# Patient Record
Sex: Male | Born: 1970 | Race: White | Hispanic: No | Marital: Married | Smoking: Former smoker
Health system: Southern US, Community
[De-identification: ages and names within clinical notes are randomized; demographics above are authoritative.]

---

## 2014-08-12 ENCOUNTER — Ambulatory Visit (INDEPENDENT_AMBULATORY_CARE_PROVIDER_SITE_OTHER): Payer: BC Managed Care – PPO

## 2014-08-12 ENCOUNTER — Ambulatory Visit (INDEPENDENT_AMBULATORY_CARE_PROVIDER_SITE_OTHER): Payer: BC Managed Care – PPO | Admitting: Sports Medicine

## 2014-08-12 ENCOUNTER — Encounter: Payer: Self-pay | Admitting: Sports Medicine

## 2014-08-12 VITALS — BP 145/92 | HR 72 | Ht 74.0 in | Wt 234.0 lb

## 2014-08-12 DIAGNOSIS — IMO0002 Reserved for concepts with insufficient information to code with codable children: Secondary | ICD-10-CM

## 2014-08-12 DIAGNOSIS — M5416 Radiculopathy, lumbar region: Secondary | ICD-10-CM

## 2014-08-12 MED ORDER — PREDNISONE 50 MG PO TABS: ORAL_TABLET | ORAL | Status: DC

## 2014-08-12 MED ORDER — MELOXICAM 15 MG PO TABS: ORAL_TABLET | ORAL | Status: DC

## 2014-08-12 NOTE — Progress Notes (Signed)
Patient ID: Lee Huber, male   DOB: [birth year], 43 y.o.   MRN: [id number]    Subjective:    I'm seeing this patient as a consultation for:  Radiontchenko, Lee Manns, MD   CC: Right knee weakness  HPI: Lee Huber is a 43 year old male with history of lumbar disc herniation who presents with 2 months of weakness in the right knee. He states that this began while doing squats in a cross-fit workout, when he experienced a shooting sensation running from the right side of his back into his right toes that he describes as "electric." Reports no numbness, tingling, or altered sensation after this initial event; however, has had persistent weakness of his right knee. He states that he often feels his knee might collapse while exercising. Has tried a knee brace while running that he claims provides minimal relief. No history of previous injury to the knee. Does see a neurologist for management of his disc herniation; however, he is unable to recall the level of the herniation.  Past medical history, Surgical history, Family history not pertinant except as noted below, Social history, Allergies, and medications have been entered into the medical record, reviewed, and no changes needed.   Review of Systems: No headache, visual changes, nausea, vomiting, diarrhea, constipation, dizziness, abdominal pain, skin rash, fevers, chills, night sweats, weight loss, swollen lymph nodes, body aches, joint swelling, muscle aches, chest pain, shortness of breath, mood changes, visual or auditory hallucinations.   Objective:   General: Well Developed, well nourished, and in no acute distress.  Neuro/Psych: Alert and oriented x3, extra-ocular muscles intact, able to move all 4 extremities, sensation grossly intact. Skin: Warm and dry, no rashes noted.  Respiratory: Not using accessory muscles, speaking in full sentences, trachea midline.  Cardiovascular: Pulses palpable, no extremity edema. Abdomen: Does not appear  distended.   Right Knee: Normal to inspection with no erythema or effusion or obvious bony abnormalities. Palpation normal with no warmth, joint line tenderness, patellar tenderness, or condyle tenderness. ROM full in flexion and extension and lower leg rotation. Ligaments with solid consistent endpoints including ACL, PCL, LCL, MCL. Negative Mcmurray's, Apley's, and Thessalonian tests. Non painful patellar compression. Patellar glide without crepitus. Patellar and quadriceps tendons unremarkable. Hamstring and quadriceps strength is normal.   Back Exam:  Inspection: Unremarkable  Motion: Flexion 45 deg, Extension 45 deg, Side Bending to 45 deg bilaterally,  Rotation to 45 deg bilaterally  SLR laying: Briefly positive XSLR laying: Negative  Palpable tenderness: None. FABER: negative. Sensory change: Gross sensation intact to all lumbar and sacral dermatomes.  Reflexes: 2+ at both patellar tendons, 2+ at achilles tendons, Babinski's downgoing.   Strength at foot  Plantar-flexion: 5/5 Dorsi-flexion: 5/5 Eversion: 5/5 Inversion: 5/5  Leg strength  Quad: 5/5 Hamstring: 5/5 Hip flexor: 5/5 Hip abductors: 5/5  Gait unremarkable.  Impression and Recommendations:   This case required medical decision making of moderate complexity.  Right Knee Pain: Completely negative knee exam suggests that this weakness is not attributable to injury of the knee. With a previous history of disc herniation that has caused radicular symptoms in the left leg, this history of right-sided radicular symptoms with subsequent muscular weakness is likely radiculopathy from the protruding lumbar disc. MRI is not currently available in order to identify the exact involved disc. - Meloxicam - Referral to PT - 5 day course of Prednisone - If no better in 1 month, will perform MRI of the spine to evaluate disc herniation further.

## 2014-08-12 NOTE — Assessment & Plan Note (Signed)
Right-sided radiculopathy. He will avoid provocative maneuvers in the gym, adding formal physical therapy, prednisone, Mobic. X-rays. Return to see me in 4 weeks, MRI for interventional injection planning if no better.

## 2014-08-19 ENCOUNTER — Ambulatory Visit: Payer: BC Managed Care – PPO | Admitting: Physical Therapy

## 2014-08-22 ENCOUNTER — Ambulatory Visit (INDEPENDENT_AMBULATORY_CARE_PROVIDER_SITE_OTHER): Payer: BC Managed Care – PPO | Admitting: Physical Therapy

## 2014-08-22 DIAGNOSIS — M6281 Muscle weakness (generalized): Secondary | ICD-10-CM

## 2014-08-22 DIAGNOSIS — IMO0002 Reserved for concepts with insufficient information to code with codable children: Secondary | ICD-10-CM

## 2014-08-22 DIAGNOSIS — R5381 Other malaise: Secondary | ICD-10-CM

## 2014-08-29 ENCOUNTER — Encounter (INDEPENDENT_AMBULATORY_CARE_PROVIDER_SITE_OTHER): Payer: BC Managed Care – PPO | Admitting: Physical Therapy

## 2014-08-29 DIAGNOSIS — M6281 Muscle weakness (generalized): Secondary | ICD-10-CM

## 2014-08-29 DIAGNOSIS — R5381 Other malaise: Secondary | ICD-10-CM

## 2014-08-29 DIAGNOSIS — IMO0002 Reserved for concepts with insufficient information to code with codable children: Secondary | ICD-10-CM

## 2014-09-05 ENCOUNTER — Encounter (INDEPENDENT_AMBULATORY_CARE_PROVIDER_SITE_OTHER): Payer: BC Managed Care – PPO | Admitting: Physical Therapy

## 2014-09-05 DIAGNOSIS — R5381 Other malaise: Secondary | ICD-10-CM

## 2014-09-05 DIAGNOSIS — M6281 Muscle weakness (generalized): Secondary | ICD-10-CM

## 2014-09-05 DIAGNOSIS — IMO0002 Reserved for concepts with insufficient information to code with codable children: Secondary | ICD-10-CM

## 2014-09-10 ENCOUNTER — Encounter: Payer: Self-pay | Admitting: Sports Medicine

## 2014-09-10 ENCOUNTER — Ambulatory Visit (INDEPENDENT_AMBULATORY_CARE_PROVIDER_SITE_OTHER): Payer: BC Managed Care – PPO | Admitting: Sports Medicine

## 2014-09-10 VITALS — BP 146/94 | HR 62 | Ht 74.0 in | Wt 234.0 lb

## 2014-09-10 DIAGNOSIS — IMO0002 Reserved for concepts with insufficient information to code with codable children: Secondary | ICD-10-CM | POA: Diagnosis not present

## 2014-09-10 DIAGNOSIS — M5416 Radiculopathy, lumbar region: Secondary | ICD-10-CM

## 2014-09-10 NOTE — Progress Notes (Signed)
Patient ID: Lee Huber, male   DOB: 11-10-1971, 43 y.o.   MRN: [id number]  Subjective:    CC: Bilateral knee weakness  HPI: Lee Huber is a very pleasant 43 year old man with history of lumbar disc herniation who presents with bilateral knee weakness, left worse than right, with associated left-sided radicular symptoms in the L3/L4 distribution. Left-sided leg weakness with radicular symptoms due to disc herniation has been long-standing and is managed by an outside neurologist. Right-sided weakness started 3 months ago while he was doing squats and felt an electric, shooting sensation from the right side of his back into his right toes. Radicular symptoms of the right leg subsequently subsided. During our visit last month (8/24), he was started on conservative treatment with a 5 day course of Prednisone, daily Meloxicam, and formal PT. He states that these interventions have provided absolutely no relief. No saddle paresthesias, loss of bowel or bladder control. States that his gait changed slightly after his weight lifting accident, but has not continued to deteriorate since then.  Past medical history, Surgical history, Family history not pertinant except as noted below, Social history, Allergies, and medications have been entered into the medical record, reviewed, and no changes needed.   Review of Systems: No fevers, chills, night sweats, weight loss, chest pain, or shortness of breath.   Objective:    General: Well developed, well nourished, and in no acute distress.  Neuro: Alert and oriented x3, extra-ocular muscles intact, sensation grossly intact.  HEENT: Normocephalic, atraumatic, pupils equal round reactive to light, neck supple. Skin: Warm and dry, no rashes. Cardiac: Regular rate and rhythm, no murmurs rubs or gallops, no lower extremity edema.  Respiratory: Clear to auscultation bilaterally. Not using accessory muscles, speaking in full sentences.  Back Exam:  Inspection:  Unremarkable  Motion: Flexion 45 deg, Extension 45 deg, Side Bending to 45 deg bilaterally,  Rotation to 45 deg bilaterally  SLR laying: Negative  XSLR laying: Negative  Palpable tenderness: None. FABER: negative. Sensory change: Gross sensation intact to all lumbar and sacral dermatomes.  Reflexes: 2+ at both patellar tendons, 2+ at achilles tendons, Babinski's downgoing.  Strength at foot  Plantar-flexion: 5/5 Dorsi-flexion: 5/5 Eversion: 5/5 Inversion: 5/5  Leg strength  Quad: 5/5 Hamstring: 5/5 Hip flexor: 5/5 Hip abductors: 5/5  Gait unremarkable.  Impression and Recommendations:   Lumbar Radiculopathy: This bilateral lumbar radiculopathy, left worse than right, appears to be in an L3 distribution. He feels that he is not overly limited by the weakness and would like to continue conservative treatment with rehabilitation exercises at this time. - Continue rehabilitation exercises for an additional 2 months, with plan to proceed with MRI and intervention if no improvement.

## 2014-09-10 NOTE — Assessment & Plan Note (Signed)
Lumbar radiculopathy is bilateral, currently left worse than right, and an L3 distribution. Overall he's not doing that bad so he would like to continue rehabilitation exercises for an additional 2 months before proceeding to advanced imaging and intervention.

## 2014-09-12 ENCOUNTER — Encounter: Payer: BC Managed Care – PPO | Admitting: Physical Therapy

## 2014-11-11 ENCOUNTER — Ambulatory Visit (INDEPENDENT_AMBULATORY_CARE_PROVIDER_SITE_OTHER): Payer: BC Managed Care – PPO | Admitting: Sports Medicine

## 2014-11-11 ENCOUNTER — Encounter: Payer: Self-pay | Admitting: Sports Medicine

## 2014-11-11 DIAGNOSIS — M5416 Radiculopathy, lumbar region: Secondary | ICD-10-CM | POA: Diagnosis not present

## 2014-11-11 NOTE — Progress Notes (Signed)
  Subjective:    CC: Follow-up  HPI: Left lumbar radiculopathy: Left-sided, L3 distribution, pain is moderate, persistent. He recently went to a local pain doctor who did 2 epidurals at the L5-S1 level per patient report, there was no MRIs done. He has never had interventional treatment at the L3-L4 level. Pain is moderate, persistent.  Past medical history, Surgical history, Family history not pertinant except as noted below, Social history, Allergies, and medications have been entered into the medical record, reviewed, and no changes needed.   Review of Systems: No fevers, chills, night sweats, weight loss, chest pain, or shortness of breath.   Objective:    General: Well Developed, well nourished, and in no acute distress.  Neuro: Alert and oriented x3, extra-ocular muscles intact, sensation grossly intact.  HEENT: Normocephalic, atraumatic, pupils equal round reactive to light, neck supple, no masses, no lymphadenopathy, thyroid nonpalpable.  Skin: Warm and dry, no rashes. Cardiac: Regular rate and rhythm, no murmurs rubs or gallops, no lower extremity edema.  Respiratory: Clear to auscultation bilaterally. Not using accessory muscles, speaking in full sentences.  Impression and Recommendations:

## 2014-11-11 NOTE — Assessment & Plan Note (Signed)
Status post two epidurals, sounds to be L5-S1. Not much response. It sounds like this was done without an MRI. Clinically his symptoms represent more of a left-sided L3 radiculopathy. We will obtain records from his spine interventional list. I am going to obtain an MRI, and this will likely lead to an L3-L4 interlaminar epidural.

## 2014-11-12 ENCOUNTER — Telehealth: Payer: Self-pay | Admitting: *Deleted

## 2014-11-12 NOTE — Telephone Encounter (Signed)
No prior auth required for MRI lumbar as per Rosanne AshingJim at [facility]. Radiology notified. Corliss SkainsJamie Quintrell Baze, CMA

## 2014-11-13 IMAGING — CR DG LUMBAR SPINE COMPLETE 4+V
5 series · 5 of 5 positions shown · non-contrast
Comparison: None.

CLINICAL DATA: Bilateral leg weakness.  Right lumbar radiculopathy.

EXAM:
LUMBAR SPINE - COMPLETE 4+ VIEW

[view not recorded (1 of 5)]
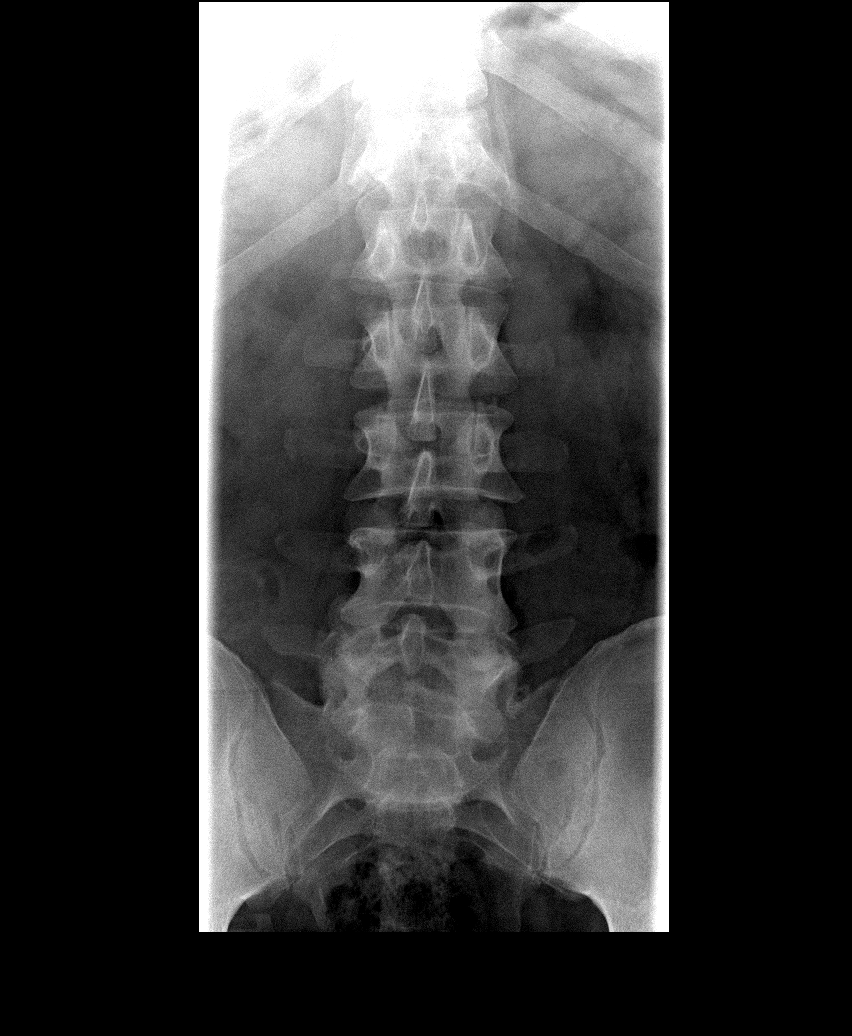

[view not recorded (2 of 5)]
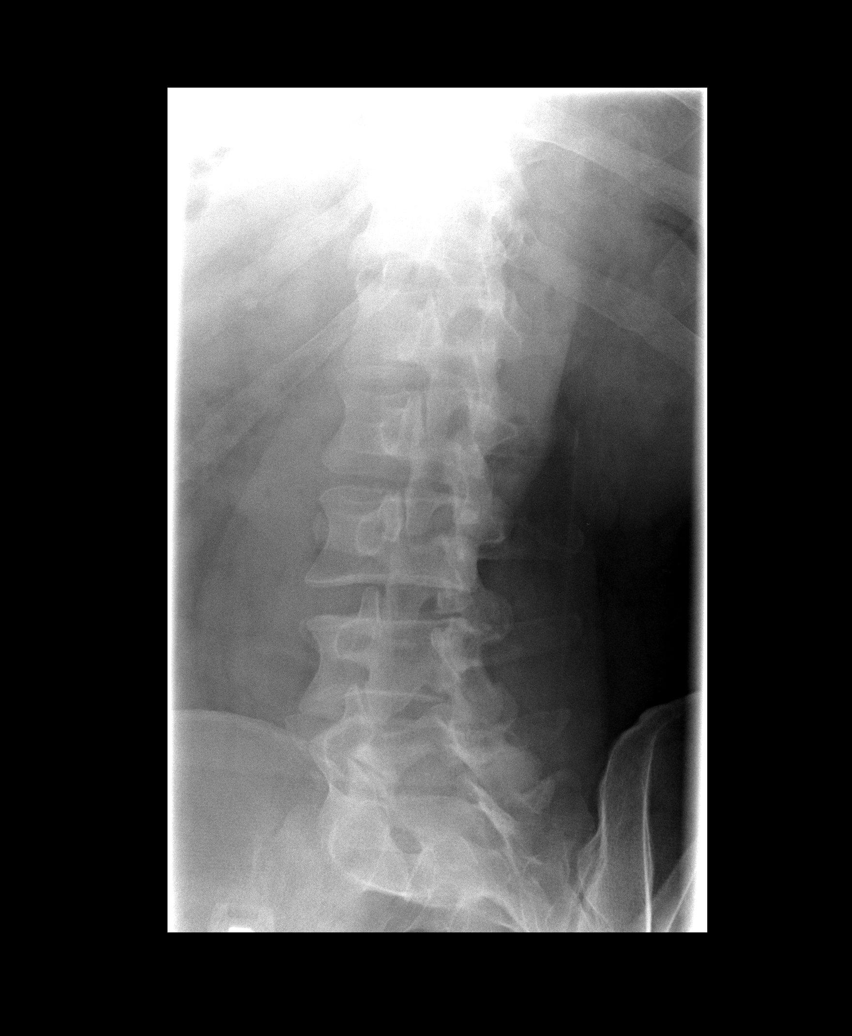

[view not recorded (3 of 5)]
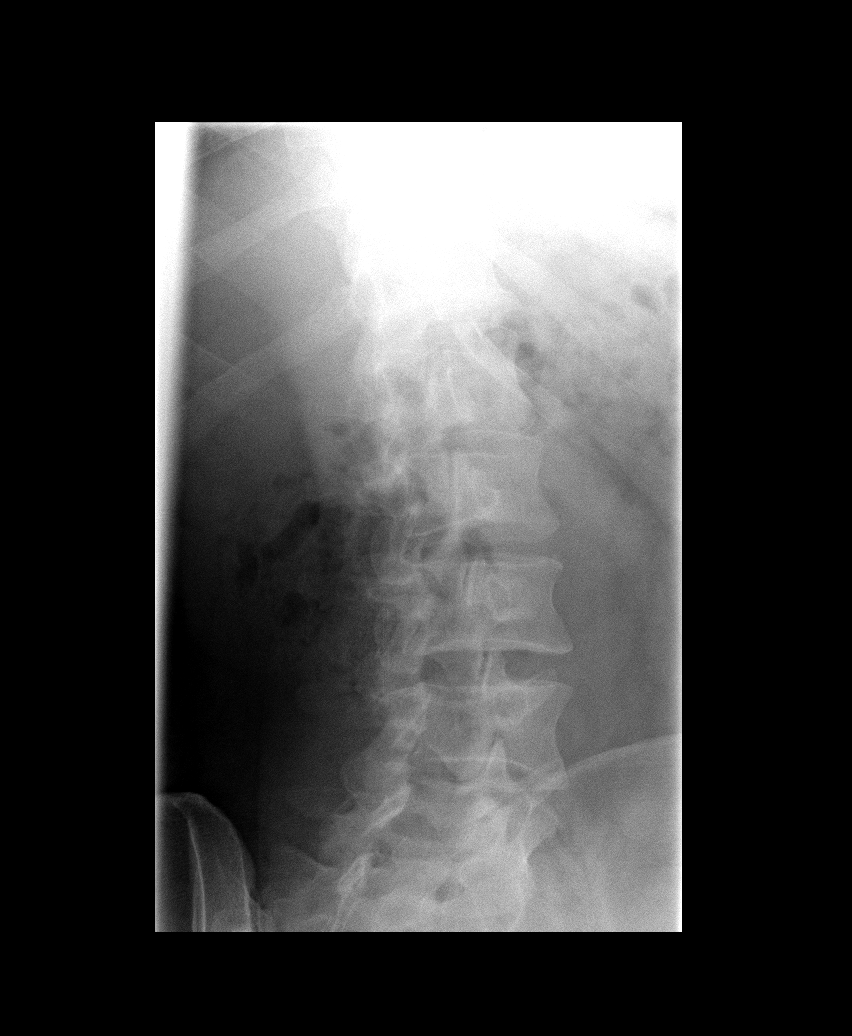

[view not recorded (4 of 5)]
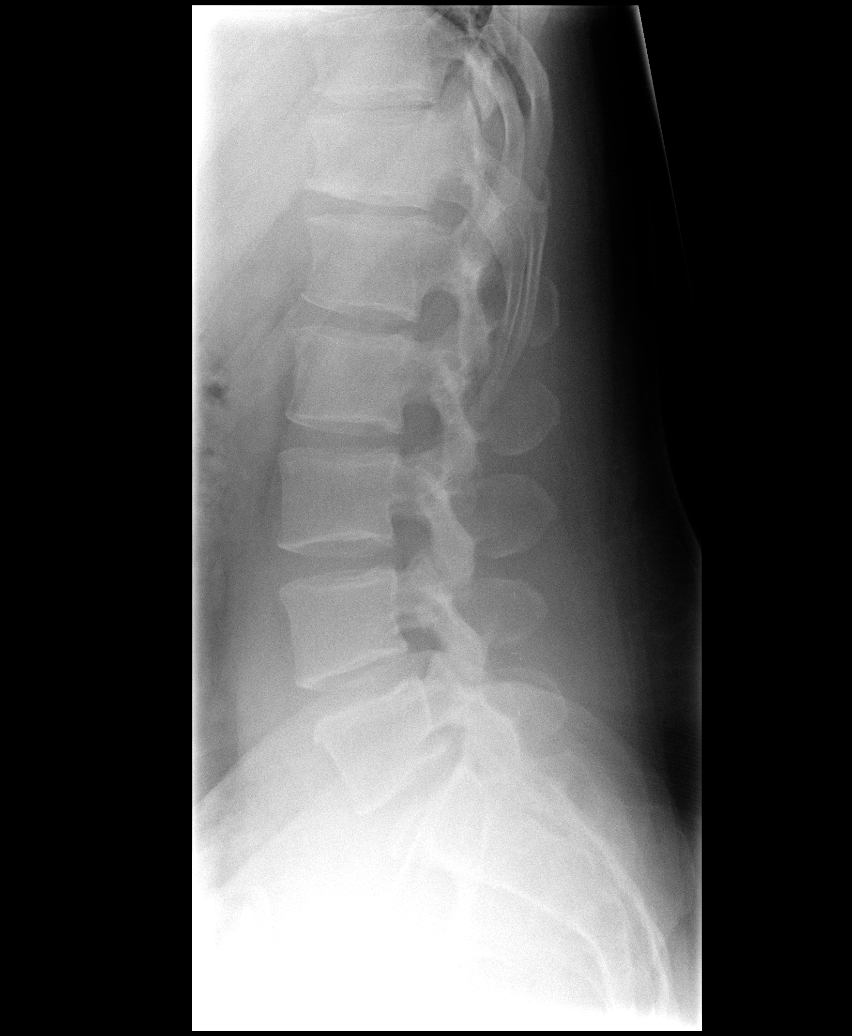

[view not recorded (5 of 5)]
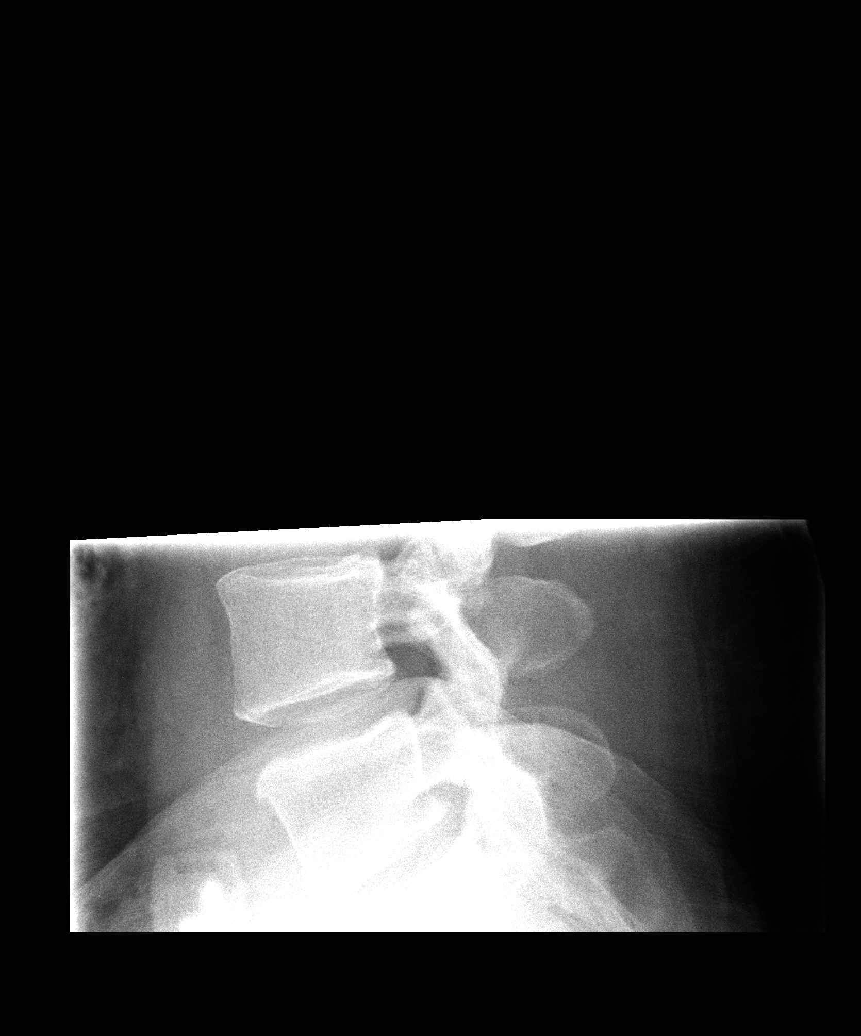

[5 of 5 positions shown; findings below may reference images not displayed]

FINDINGS: There is no fracture, subluxation, disc space narrowing, facet
arthritis, or other abnormality.
IMPRESSION: Normal lumbar spine.

## 2014-11-18 ENCOUNTER — Ambulatory Visit (INDEPENDENT_AMBULATORY_CARE_PROVIDER_SITE_OTHER): Payer: BC Managed Care – PPO

## 2014-11-18 DIAGNOSIS — M5416 Radiculopathy, lumbar region: Secondary | ICD-10-CM

## 2014-11-25 ENCOUNTER — Encounter: Payer: Self-pay | Admitting: Sports Medicine

## 2014-11-25 ENCOUNTER — Ambulatory Visit (INDEPENDENT_AMBULATORY_CARE_PROVIDER_SITE_OTHER): Payer: BC Managed Care – PPO | Admitting: Sports Medicine

## 2014-11-25 VITALS — BP 140/90 | HR 98 | Ht 74.0 in | Wt 232.0 lb

## 2014-11-25 DIAGNOSIS — M25512 Pain in left shoulder: Secondary | ICD-10-CM | POA: Diagnosis not present

## 2014-11-25 DIAGNOSIS — M5416 Radiculopathy, lumbar region: Secondary | ICD-10-CM | POA: Diagnosis not present

## 2014-11-25 NOTE — Assessment & Plan Note (Signed)
Injection as above 

## 2014-11-25 NOTE — Progress Notes (Signed)
  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 He tells me he has had epidurals and a nerve conduction study in the past that were ineffective. We are still unsure as to the exact level of the epidural was placed, and the results of the nerve conduction study. MRI results will be dictated below.  Left shoulder pain: Occurred after a heavy workout, localized at the acromioclavicular joint without radiation, pain is worse with crossarm movements.  Past medical history, Surgical history, Family history not pertinant except as noted below, Social history, Allergies, and medications have been entered into the medical record, reviewed, and no changes needed.   Review of Systems: No fevers, chills, night sweats, weight loss, chest pain, or shortness of breath.   Objective:    General: Well Developed, well nourished, and in no acute distress.  Neuro: Alert and oriented x3, extra-ocular muscles intact, sensation grossly intact.  HEENT: Normocephalic, atraumatic, pupils equal round reactive to light, neck supple, no masses, no lymphadenopathy, thyroid nonpalpable.  Skin: Warm and dry, no rashes. Cardiac: Regular rate and rhythm, no murmurs rubs or gallops, no lower extremity edema.  Respiratory: Clear to auscultation bilaterally. Not using accessory muscles, speaking in full sentences. Left Shoulder: Inspection reveals no abnormalities, atrophy or asymmetry. Exquisitely tender to palpation to the acromioclavicular joint with reproduction of pain with a positive cross arm sign. ROM is full in all planes. Rotator cuff strength normal throughout. No signs of impingement with negative Neer and Hawkin's tests, empty can. Speeds and Yergason's tests normal. No labral pathology noted with negative Obrien's, negative crank, negative clunk, and good stability. Normal scapular function observed. No painful arc and no  drop arm sign. No apprehension sign  Procedure: Real-time Ultrasound Guided Injection of left acromioclavicular joint Device: GE Logiq E  Verbal informed consent obtained.  Time-out conducted.  Noted no overlying erythema, induration, or other signs of local infection.  Skin prepped in a sterile fashion.  Local anesthesia: Topical Ethyl chloride.  With sterile technique and under real time ultrasound guidance:  0.5-gauge needle advanced into joint, 0.5 mL kenalog 40, 0.5 mL lidocaine injected easily. Completed without difficulty  Pain immediately resolved suggesting accurate placement of the medication.  Advised to call if fevers/chills, erythema, induration, drainage, or persistent bleeding.  Images permanently stored and available for review in the ultrasound unit.  Impression: Technically successful ultrasound guided injection.  Impression and Recommendations:

## 2014-11-25 NOTE — Assessment & Plan Note (Signed)
It sounds as though he has had nerve conduction and electromyography that confirmed a lumbar radiculopathy, he did have a couple of epidurals that were ineffective. We are going to proceed with a left-sided L5-S1 transforaminal epidural, he is able to endorse numbness and tingling in an L5 distribution. 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

## 2014-12-02 ENCOUNTER — Ambulatory Visit
Admission: RE | Admit: 2014-12-02 | Discharge: 2014-12-02 | Disposition: A | Discharge status: Home / Self Care | Payer: BC Managed Care – PPO | Source: Ambulatory Visit | Attending: Sports Medicine | Admitting: Sports Medicine

## 2014-12-02 DIAGNOSIS — M5416 Radiculopathy, lumbar region: Secondary | ICD-10-CM

## 2014-12-02 MED ORDER — IOHEXOL 180 MG/ML  SOLN
1.0000 mL | Freq: Once | INTRAMUSCULAR | Status: AC | PRN
  Administered 2014-12-02: 1 mL via EPIDURAL

## 2014-12-02 MED ORDER — METHYLPREDNISOLONE ACETATE 40 MG/ML INJ SUSP (RADIOLOG
120.0000 mg | Freq: Once | INTRAMUSCULAR | Status: AC
  Administered 2014-12-02: 120 mg via EPIDURAL

## 2014-12-02 NOTE — Discharge Instructions (Signed)

## 2015-02-06 ENCOUNTER — Ambulatory Visit: Payer: Self-pay | Admitting: Sports Medicine

## 2015-02-06 DIAGNOSIS — Z0289 Encounter for other administrative examinations: Secondary | ICD-10-CM

## 2015-03-04 ENCOUNTER — Encounter: Payer: Self-pay | Admitting: Sports Medicine

## 2015-03-04 ENCOUNTER — Ambulatory Visit (INDEPENDENT_AMBULATORY_CARE_PROVIDER_SITE_OTHER): Payer: BLUE CROSS/BLUE SHIELD | Admitting: Sports Medicine

## 2015-03-04 VITALS — BP 140/88 | HR 63 | Wt 233.0 lb

## 2015-03-04 DIAGNOSIS — M25512 Pain in left shoulder: Secondary | ICD-10-CM | POA: Diagnosis not present

## 2015-03-04 DIAGNOSIS — M5416 Radiculopathy, lumbar region: Secondary | ICD-10-CM

## 2015-03-04 NOTE — Assessment & Plan Note (Signed)
Completely resolved after left acromioclavicular injection

## 2015-03-04 NOTE — Assessment & Plan Note (Signed)
Temporary response to left L5-S1 transforaminal epidural. This was his third epidural. For approximately 1 week he felt as though his symptoms had resolved. He has failed gabapentin. At this point I do think he has become a surgical candidate, he does not have any pain but simply numbness and tingling, weakness, and what seems to be a loss of proprioception. Referral to Dr. Yevette Edwardsumonski for further evaluation.

## 2015-03-04 NOTE — Progress Notes (Signed)
  Subjective:    CC: Follow-up  HPI: Left acromioclavicular arthritis: Completely resolved after ultrasound guided injection.  Left leg numbness and tingling: History of a couple of epidurals, he does have an L5-S1 degenerative disc disease, he had a fantastic response for a week with resolution of numbness and tingling after a left L5-S1 transforaminal epidural, unfortunately all symptoms have returned. He did have some anterolateral thigh numbness and tingling so we had him wear loose clothing, unfortunately these symptoms remain. He has tried gabapentin without any improvement. Amenable to discuss this with spine surgery. He has already had lumbar nerve conduction studies confirming lumbar radiculitis.  Past medical history, Surgical history, Family history not pertinant except as noted below, Social history, Allergies, and medications have been entered into the medical record, reviewed, and no changes needed.   Review of Systems: No fevers, chills, night sweats, weight loss, chest pain, or shortness of breath.   Objective:    General: Well Developed, well nourished, and in no acute distress.  Neuro: Alert and oriented x3, extra-ocular muscles intact, sensation grossly intact.  HEENT: Normocephalic, atraumatic, pupils equal round reactive to light, neck supple, no masses, no lymphadenopathy, thyroid nonpalpable.  Skin: Warm and dry, no rashes. Cardiac: Regular rate and rhythm, no murmurs rubs or gallops, no lower extremity edema.  Respiratory: Clear to auscultation bilaterally. Not using accessory muscles, speaking in full sentences. Left Shoulder: Inspection reveals no abnormalities, atrophy or asymmetry. Palpation is normal with no tenderness over AC joint or bicipital groove. ROM is full in all planes. Rotator cuff strength normal throughout. No signs of impingement with negative Neer and Hawkin's tests, empty can. Speeds and Yergason's tests normal. No labral pathology noted with  negative Obrien's, negative crank, negative clunk, and good stability. Normal scapular function observed. No painful arc and no drop arm sign. No apprehension sign  Impression and Recommendations:

## 2015-05-13 ENCOUNTER — Encounter: Payer: Self-pay | Admitting: Sports Medicine

## 2015-05-13 ENCOUNTER — Ambulatory Visit (INDEPENDENT_AMBULATORY_CARE_PROVIDER_SITE_OTHER): Payer: BLUE CROSS/BLUE SHIELD | Admitting: Sports Medicine

## 2015-05-13 ENCOUNTER — Ambulatory Visit (INDEPENDENT_AMBULATORY_CARE_PROVIDER_SITE_OTHER): Payer: BLUE CROSS/BLUE SHIELD

## 2015-05-13 VITALS — BP 145/94 | HR 74 | Ht 74.0 in | Wt 234.0 lb

## 2015-05-13 DIAGNOSIS — M2240 Chondromalacia patellae, unspecified knee: Secondary | ICD-10-CM | POA: Diagnosis not present

## 2015-05-13 DIAGNOSIS — M25562 Pain in left knee: Secondary | ICD-10-CM

## 2015-05-13 DIAGNOSIS — M25561 Pain in right knee: Secondary | ICD-10-CM

## 2015-05-13 MED ORDER — MELOXICAM 15 MG PO TABS: ORAL_TABLET | ORAL | Status: AC

## 2015-05-13 NOTE — Assessment & Plan Note (Signed)
With crepitus and tenderness under the lateral and medial patellar facets bilaterally. Meloxicam, x-rays, formal physical therapy, knee braces, he will return for custom orthotics. Injection, followed by Visco supplementation if no better.

## 2015-05-13 NOTE — Progress Notes (Signed)
  Subjective:    CC: Follow-up  HPI: Left lumbar radiculopathy: Deemed not a surgical candidate, symptoms are insufficiently worrisome to warrant intervention.  Bilateral knee pain: Anterior, with crepitus, worse going up and down stairs as well as with stiffness in the morning, pain is moderate, persistent without radiation or mechanical symptoms.  Past medical history, Surgical history, Family history not pertinant except as noted below, Social history, Allergies, and medications have been entered into the medical record, reviewed, and no changes needed.   Review of Systems: No fevers, chills, night sweats, weight loss, chest pain, or shortness of breath.   Objective:    General: Well Developed, well nourished, and in no acute distress.  Neuro: Alert and oriented x3, extra-ocular muscles intact, sensation grossly intact.  HEENT: Normocephalic, atraumatic, pupils equal round reactive to light, neck supple, no masses, no lymphadenopathy, thyroid nonpalpable.  Skin: Warm and dry, no rashes. Cardiac: Regular rate and rhythm, no murmurs rubs or gallops, no lower extremity edema.  Respiratory: Clear to auscultation bilaterally. Not using accessory muscles, speaking in full sentences. Bilateral Knee: Normal to inspection with no erythema or effusion or obvious bony abnormalities. Tender to palpation of the medial and lateral patellar facets with crepitus as the knee is taken through the range of motion ROM normal in flexion and extension and lower leg rotation. Ligaments with solid consistent endpoints including ACL, PCL, LCL, MCL. Negative Mcmurray's and provocative meniscal tests. Non painful patellar compression. Patellar and quadriceps tendons unremarkable. Hamstring and quadriceps strength is normal. Hip abductor strength is good.  Impression and Recommendations:

## 2015-05-26 ENCOUNTER — Ambulatory Visit (INDEPENDENT_AMBULATORY_CARE_PROVIDER_SITE_OTHER): Payer: BLUE CROSS/BLUE SHIELD | Admitting: Rehabilitative and Restorative Service Providers"

## 2015-05-26 DIAGNOSIS — R531 Weakness: Secondary | ICD-10-CM

## 2015-05-26 DIAGNOSIS — M2241 Chondromalacia patellae, right knee: Secondary | ICD-10-CM | POA: Diagnosis not present

## 2015-05-26 DIAGNOSIS — M25562 Pain in left knee: Secondary | ICD-10-CM | POA: Diagnosis not present

## 2015-05-26 DIAGNOSIS — M2242 Chondromalacia patellae, left knee: Secondary | ICD-10-CM

## 2015-05-26 DIAGNOSIS — M25561 Pain in right knee: Secondary | ICD-10-CM

## 2015-05-26 NOTE — Patient Instructions (Signed)
Lying on side at the edge of the bed bend bottom leg, stretch top leg back keeping knee straight, allowing foot to drop toward the floor  Hold for 20-40 sec 3 reps   Outer Hip Stretch: Reclined IT Band Stretch (Strap)   Strap around opposite foot, pull across only as far as possible with shoulders on mat. Hold for _20-30 sec Repeat __2-3__ times each leg.    Use strap Hip Flexors - Prone  Roll under knee - strap around foot to assist with stretch  Try to keep hips flat  Hold 20-30___ seconds. _3_ reps per set, _2-3__ sets per day    Hamstring Stretch  Use strap or belt  With other leg bent, foot flat, grasp right leg and slowly try to straighten knee. Hold _30___ seconds. Repeat __3__ times. Do _2-3___ sessions per day.  Stretch out strap - [email or web address] - can order ADDUCTION: Isometric   With ball between knees, squeeze them inward. Hold 5-10___ seconds. Complete __1-3_ sets of _10__ repetitions. Perform _1-2__ sessions per day.         Hamstrings / Gastrocnemius   Left leg up on wall, opposite leg straight on floor. Push knee toward wall, bending ankle toward floor. May need to add a sheet to sole of foot to stretch gastroc at the same time. Do not roll hips up or lift head off floor. Hold 2-5 minutes. Repeat __1__ times. Do _1-2___ sessions per day. CAUTION: Stretch should be gentle, steady and slow.

## 2015-05-26 NOTE — Therapy (Addendum)
[place], [place], [address] Phone: [phone]   Fax:  [phone]  Physical Therapy Evaluation  Patient Details  Name: Lee Huber MRN: [id number] Date of Birth: [birth year] Referring Provider:  Silverio Decamp,*  Encounter Date: 05/26/2015      PT End of Session - 05/26/15 1534    Visit Number 1   Number of Visits 16   Date for PT Re-Evaluation 07/21/15   PT Start Time 2993   PT Stop Time 7169   PT Time Calculation (min) 78 min   Activity Tolerance Patient tolerated treatment well;No increased pain      No past medical history on file.  No past surgical history on file.  There were no vitals filed for this visit.  Visit Diagnosis:  Pain in joint, lower leg, left - Plan: PT plan of care cert/re-cert  Pain in joint, lower leg, right - Plan: PT plan of care cert/re-cert  Weakness generalized - Plan: PT plan of care cert/re-cert  Chondromalacia of both patellae - Plan: PT plan of care cert/re-cert      Subjective Assessment - 05/26/15 1537    Subjective Bad knees for over 10 years steadily worsening- pain with squatting and returning to stand; prolonged bent position; climbing stairs; running   Pertinent History OA bilat knees   Limitations Walking   How long can you sit comfortably? 20-30 minutes   How long can you stand comfortably? 10-20 minutes   How long can you walk comfortably? 1-2 hours   Diagnostic tests X-rays - OA   Patient Stated Goals Learn exercises to strengthening knees and improve activity level   Currently in Pain? No/denies           [facility] PT Assessment - 05/26/15 0001    Assessment   Medical Diagnosis Bilat knee pain   Onset Date/Surgical Date 12/20/04   Hand Dominance Right   Next MD Visit 05/29/15   Balance Screen   Has the patient fallen in the past 6 months Yes   How many times? 2-3   Has the patient had a decrease in activity level because of  a fear of falling?  Yes   Is the patient reluctant to leave their home because of a fear of falling?  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   Vocation Requirements standing, walking, stairs 8 hr/day/5 d/wk   Leisure jogging 2x/wk; gym wts/2x/wk machines and free weights; yard work; household chores   Observation/Other Assessments   Focus on Therapeutic Outcomes (FOTO)  36% limitation   Sensation   Additional Comments numb anterior Lt thigh for yrs unknown cause - has seen neurologist    AROM   Overall AROM  Within functional limits for tasks performed  Hip/knee/ankle bilat   Overall AROM Comments tightness noted at end ranges with figure 4 R, hip ext with knee flex, HS   Strength   Overall Strength Within functional limits for tasks performed   Right/Left Knee --  hip flex 5-/5; abd 5-/5 tested supine/sidelying   Flexibility   Soft Tissue Assessment /Muscle Length yes   Hamstrings 65 degrees bilat   ITB tightness noted Bilat Rt > Lt   Piriformis tight bilat  Palpation   Patella mobility limited mobility poor tracking - lateral quad working >>>medial with quad set   Palpation comment +pain with patella grind              [facility] Adult PT Treatment/Exercise - 05/26/15 0001    Knee/Hip Exercises: Stretches   Passive Hamstring Stretch 30 seconds  with strap opposite knee bent 3 reps    Quad Stretch 20 seconds  with hip flexor stretch in prone/strap + ball under knee x3    ITB Stretch 30 seconds  Sidelyning LE off edge of table x3   ITB Stretch Limitations IT/hip abd stretch supine with strap 20 sec x3   Gastroc Stretch --  reviewed for HEP   Soleus Stretch --  reviewed for HEP   Cryotherapy   Number Minutes Cryotherapy 12 Minutes   Cryotherapy  Location Knee  bilat   Type of Cryotherapy Ice pack   Manual Therapy   Manual Therapy Myofascial release  instructed in transverse friction massage lateral quad 2 min   Manual therapy comments Myofacial release ball lateral thigh                PT Education - 05/26/15 1650    Education provided Yes   Education Details Educatioin re knee mechanics and patellar alignment; HEP   Person(s) Educated Patient   Methods Explanation;Demonstration;Tactile cues;Verbal cues;Handout   Comprehension Verbalized understanding;Returned demonstration;Verbal cues required;Tactile cues required          PT Short Term Goals - 05/26/15 1657    PT SHORT TERM GOAL #1   Title I in initial HEP   Time 3   Period Weeks   Status New   PT SHORT TERM GOAL #2   Title Improve LE mobility and flexibility - HS stretch to 0 degrees   Time 4   Period Weeks   Status New   PT SHORT TERM GOAL #3   Title Facilitate good contraction of medial quad   Time 3   Period Weeks   Status New           PT Long Term Goals - 05/26/15 1658    PT LONG TERM GOAL #1   Title I in advanced home exercise program(07/21/15)   Time 8   Period Weeks   Status New   PT LONG TERM GOAL #2   Title Improve patelaar tracking and mecanics of knee function(07/21/15)   Time 8   Period Weeks   Status New   PT LONG TERM GOAL #3   Title Decrease knee pain to allow more functional activity level(07/21/15)   Time 8   Period Weeks   Status New   PT LONG TERM GOAL #4   Title Improve FOTO to =/< 30% limitation(07/21/15)   Time 8   Period Weeks   Status New               Plan - 05/26/15 1652    Clinical Impression Statement Patient presents with long standing history of bilat knee pain. He has decreased end range mobility in hips and knees, poor patellar alignment, pain limiting functional activity level.   Pt will benefit from skilled therapeutic intervention in order to improve on the following deficits Abnormal  gait;Decreased range of motion;Difficulty walking;Decreased activity tolerance;Pain;Impaired flexibility;Decreased strength;Decreased mobility   Rehab Potential Good   PT Frequency 2x / week   PT Treatment/Interventions ADLs/Self Care Home Management;Cryotherapy;Electrical Stimulation;Moist Heat;Ultrasound;Therapeutic activities;Therapeutic exercise;Neuromuscular re-education;Patient/family education;Manual techniques;Passive range of motion;Taping;Vasopneumatic Device   PT  Next Visit Plan Review exercises; add quad sets focus on medial quad; SLR ER at hip; progress exercsie as indicated   PT Home Exercise Plan transverse friction massage lateral quad at patella; self massage along quads and IT band; stretching; medial quad recruitment; educatioin; HEP   Consulted and Agree with Plan of Care Patient         Problem List Patient Active Problem List   Diagnosis Date Noted  . Chondromalacia of patellofemoral joint 05/13/2015  . Arthralgia of left acromioclavicular joint 11/25/2014  . Left lumbar radiculopathy 08/12/2014    Syna Gad Nilda Simmer, PT, MPH 05/26/2015, 5:33 PM  [facility] [place], [place], [address] Phone: [phone]   Fax:  [phone]    PHYSICAL THERAPY DISCHARGE SUMMARY  Visits from Start of Care: 1  Current functional level related to goals / functional outcomes: Patient was seen for initial PT eval only. He did not return call re- further appointments.   Remaining deficits: unknown   Education / Equipment: HEP  Plan: Patient agrees to discharge.  Patient goals were not met. Patient is being discharged due to not returning since the last visit.  ?????   Quintan Saldivar P. Helene Kelp, PT, MPH 07/02/15 2:39 pm

## 2015-05-29 ENCOUNTER — Encounter: Payer: BLUE CROSS/BLUE SHIELD | Admitting: Sports Medicine

## 2015-06-02 ENCOUNTER — Encounter: Payer: BLUE CROSS/BLUE SHIELD | Admitting: Physical Therapy

## 2015-06-16 ENCOUNTER — Encounter: Payer: Self-pay | Admitting: Sports Medicine

## 2015-06-16 ENCOUNTER — Ambulatory Visit (INDEPENDENT_AMBULATORY_CARE_PROVIDER_SITE_OTHER): Payer: BLUE CROSS/BLUE SHIELD | Admitting: Sports Medicine

## 2015-06-16 VITALS — BP 130/85 | HR 67 | Wt 239.0 lb

## 2015-06-16 DIAGNOSIS — M2240 Chondromalacia patellae, unspecified knee: Secondary | ICD-10-CM

## 2015-06-16 NOTE — Progress Notes (Signed)
    Patient was fitted for a : standard, cushioned, semi-rigid orthotic. The orthotic was heated and afterward the patient stood on the orthotic blank positioned on the orthotic stand. The patient was positioned in subtalar neutral position and 10 degrees of ankle dorsiflexion in a weight bearing stance. After completion of molding, a stable base was applied to the orthotic blank. The blank was ground to a stable position for weight bearing. Size: 13 Base: [facility] Additional Posting and Padding: Additional small scaphoid pad under the left orthotic The patient ambulated these, and they were very comfortable.  I spent 40 minutes with this patient, greater than 50% was face-to-face time counseling regarding the below diagnosis.

## 2015-06-16 NOTE — Assessment & Plan Note (Signed)
Custom orthotics as above. Continue physical therapy. Return to see me in 6 weeks.

## 2015-07-21 ENCOUNTER — Other Ambulatory Visit: Payer: Self-pay | Admitting: Sports Medicine

## 2015-07-21 DIAGNOSIS — M2240 Chondromalacia patellae, unspecified knee: Secondary | ICD-10-CM

## 2015-07-29 ENCOUNTER — Encounter: Payer: Self-pay | Admitting: Rehabilitative and Restorative Service Providers"

## 2015-07-29 ENCOUNTER — Ambulatory Visit (INDEPENDENT_AMBULATORY_CARE_PROVIDER_SITE_OTHER): Payer: BLUE CROSS/BLUE SHIELD | Admitting: Rehabilitative and Restorative Service Providers"

## 2015-07-29 DIAGNOSIS — M25562 Pain in left knee: Secondary | ICD-10-CM

## 2015-07-29 DIAGNOSIS — M25561 Pain in right knee: Secondary | ICD-10-CM

## 2015-07-29 DIAGNOSIS — M2241 Chondromalacia patellae, right knee: Secondary | ICD-10-CM | POA: Diagnosis not present

## 2015-07-29 DIAGNOSIS — R531 Weakness: Secondary | ICD-10-CM | POA: Diagnosis not present

## 2015-07-29 DIAGNOSIS — M2242 Chondromalacia patellae, left knee: Secondary | ICD-10-CM

## 2015-07-29 NOTE — Patient Instructions (Signed)
Straight Leg Raise: With External Leg Rotation   Lie on back with right leg straight, opposite leg bent. Rotate straight leg out and lift __8-10__ inches. Repeat __10__ times per set. Do _3_ sets per session. Do _1-2___ sessions per day.   Quad Sets   Slowly tighten thigh muscles of straight, Hold 10-20 sec   Relax. Repeat _10___ times. 3 sets of 10. Do __3__ sessions per day.    Copyright  VHI. All rights reserved.  Strengthening: Hip Adduction - Isometric   With ball or folded pillow between knees, squeeze knees together. Hold _20___ seconds. Repeat __10__ times per set. Do __3__ sets per session. Do _1-2___ sessions per day.   Strengthening: Wall Slide  Ball between knees Leaning on wall, slowly lower buttocks until thighs are parallel to floor. Hold _60-120___ seconds. Tighten thigh muscles and return. Repeat __5-10__ times per set. Do __1-3__ sets per session. Do __1-2__ sessions per day.

## 2015-07-29 NOTE — Therapy (Signed)
[facility] [place] [address] [place], [place], [address] Phone: [phone]   Fax:  [phone]  Physical Therapy Evaluation  Patient Details  Name: Lee Huber MRN: [id number] Date of Birth: 04-04-1971 Referring Provider:  Monica Becton,*  Encounter Date: 07/29/2015      PT End of Session - 07/29/15 1325    Visit Number 1   Number of Visits 6   Date for PT Re-Evaluation 09/09/15   PT Start Time 1019   PT Stop Time 1105   PT Time Calculation (min) 46 min   Activity Tolerance Patient tolerated treatment well;No increased pain      History reviewed. No pertinent past medical history.  History reviewed. No pertinent past surgical history.  There were no vitals filed for this visit.  Visit Diagnosis:  Pain in joint, lower leg, left - Plan: PT plan of care cert/re-cert  Pain in joint, lower leg, right - Plan: PT plan of care cert/re-cert  Weakness generalized - Plan: PT plan of care cert/re-cert  Chondromalacia of both patellae - Plan: PT plan of care cert/re-cert      Subjective Assessment - 07/29/15 1018    Subjective Bad knees for over 10 years steadily worsening- pain with squatting and returning to stand; prolonged bent position; climbing stairs; running - no change since last visit. Received custom inserts for both shoes ~ 1 + month ago with no change noticed.   Pertinent History OA bilat knees   Limitations Walking   How long can you sit comfortably? 20-30 minutes   How long can you stand comfortably? 10-20 minutes   How long can you walk comfortably? 1-2 hours   Diagnostic tests X-rays - OA   Patient Stated Goals Learn exercises to strengthening knees and improve activity level   Currently in Pain? No/denies  discomfort in bilat knees - which is constant in nature, intensity varies with activity esp bending knees - squatting, stairs            [facility] PT Assessment - 07/29/15 0001    Assessment    Medical Diagnosis Bilat knee pain   Onset Date/Surgical Date 12/20/04   Hand Dominance Right   Next MD Visit 05/29/15   Balance Screen   Has the patient fallen in the past 6 months No   Has the patient had a decrease in activity level because of a fear of falling?  No   Is the patient reluctant to leave their home because of a fear of falling?  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   Vocation Requirements standing, walking, stairs 8 hr/day/5 d/wk   Leisure jogging 2x/wk; gym wts/2x/wk machines and free weights; yard work; household chores   Observation/Other Assessments   Focus on Therapeutic Outcomes (FOTO)  36% limitation   Sensation   Additional Comments numb anterior Lt thigh for yrs unknown cause - has seen neurologist    AROM   Overall AROM  Within functional limits for tasks performed  Hip/knee/ankle bilat   Overall AROM Comments tightness noted at end ranges with figure 4 R, hip ext with knee flex, HS   Strength   Overall Strength Within functional limits for tasks performed   Right/Left Knee Right;Left  Flexibility   Hamstrings 67 degrees Lt; 68 deg Rt   Quadriceps 142 flexion Rt; 145 deg Lt   ITB tightness noted Bilat Rt > Lt   Piriformis tight bilat   Palpation   Patella mobility limited mobility poor tracking - lateral quad working >>>medial with quad set   Palpation comment +pain with patella grind                   [facility] Adult PT Treatment/Exercise - 07/29/15 0001    Self-Care   Self-Care --  instructed in patellar taping   Knee/Hip Exercises: Stretches   Passive Hamstring Stretch 30 seconds  with strap opposite knee bent 3 reps    Quad Stretch 20 seconds  with hip flexor stretch in prone/strap + ball under knee x3     ITB Stretch 30 seconds  Sidelyning LE off edge of table x3   Knee/Hip Exercises: Supine   Quad Sets Strengthening;Both;10 reps;1 set  10sec hold   Hip Adduction Isometric Strengthening;Both;1 set;10 reps   Straight Leg Raise with External Rotation Strengthening;Both;1 set;10 reps   Patellar Mobs with transverse friction to lateral superior patella                 PT Education - 07/29/15 1323    Education provided Yes   Education Details Education re pathology and what PT has to offer; importance of stretching and selective strengthening; HEP   Person(s) Educated Patient   Methods Explanation;Demonstration;Tactile cues;Verbal cues;Handout   Comprehension Verbalized understanding;Returned demonstration;Verbal cues required;Tactile cues required          PT Short Term Goals - 07/29/15 1331    PT SHORT TERM GOAL #1   Title I in initial HEP   Time 6   Period Weeks   Status New   PT SHORT TERM GOAL #2   Title Improve LE mobility and flexibility - HS stretch to 75 degrees   Time 6   Period Weeks   Status New   PT SHORT TERM GOAL #3   Title Facilitate good contraction of medial quad   Time 6   Period Weeks   Status New           PT Long Term Goals - 07/29/15 1332    PT LONG TERM GOAL #1   Title I in advanced home exercise program(09/09/15)   Time 6   Period Weeks   Status New   PT LONG TERM GOAL #2   Title patient I in taping for patellar alignment 09/09/15   Time 6   Period Weeks   Status New   PT LONG TERM GOAL #3   Title Decrease knee pain to allow more functional activity level(09/09/15)   Time 6   Period Weeks   Status New   PT LONG TERM GOAL #4   Title Improve FOTO to =/< 27% limitation(07/21/15)   Time 6   Period Weeks   Status New               Plan - 07/29/15 1326    Clinical Impression Statement Patient presents with long standing history of bilat knee pain which is unchanged with his modified stretching or orthotics. He is  interested in learning exercises for home and does not want to come for multiple PT visits. we will focus on instructing patient in HEP and assess response to taping techniques to improve alignment of patella. Patient continues to demonstrate decreased flexibility bilat LE's; poor patellar alignment; pain limiting functional  activity level.    Pt will benefit from skilled therapeutic intervention in order to improve on the following deficits Abnormal gait;Decreased range of motion;Difficulty walking;Decreased activity tolerance;Pain;Impaired flexibility;Decreased strength;Decreased mobility   Rehab Potential Good   PT Frequency 1x / week   PT Duration 6 weeks   PT Treatment/Interventions ADLs/Self Care Home Management;Cryotherapy;Electrical Stimulation;Moist Heat;Ultrasound;Therapeutic activities;Therapeutic exercise;Neuromuscular re-education;Patient/family education;Manual techniques;Passive range of motion;Taping;Vasopneumatic Device   PT Next Visit Plan Review exercises; add quad sets focus on medial quad; SLR ER at hip; progress exercsie as indicated   PT Home Exercise Plan transverse friction massage lateral quad at patella; self massage along quads and IT band; stretching; medial quad recruitment; educatioin; HEP; SLS balance activities/heel touch eccentric lowering at step; instruct in taping for patellar allignment.   Consulted and Agree with Plan of Care Patient         Problem List Patient Active Problem List   Diagnosis Date Noted  . Chondromalacia of patellofemoral joint 05/13/2015  . Arthralgia of left acromioclavicular joint 11/25/2014  . Left lumbar radiculopathy 08/12/2014    Riannon Mukherjee Rober Minion, PT.MPH 07/29/2015, 1:39 PM  [facility] [address] [place], [place], [address] Phone: [phone]   Fax:  [phone]

## 2015-08-06 ENCOUNTER — Encounter: Payer: Self-pay | Admitting: Rehabilitative and Restorative Service Providers"

## 2015-08-06 ENCOUNTER — Ambulatory Visit (INDEPENDENT_AMBULATORY_CARE_PROVIDER_SITE_OTHER): Payer: BLUE CROSS/BLUE SHIELD | Admitting: Rehabilitative and Restorative Service Providers"

## 2015-08-06 DIAGNOSIS — M25561 Pain in right knee: Secondary | ICD-10-CM | POA: Diagnosis not present

## 2015-08-06 DIAGNOSIS — M2241 Chondromalacia patellae, right knee: Secondary | ICD-10-CM

## 2015-08-06 DIAGNOSIS — M25562 Pain in left knee: Secondary | ICD-10-CM

## 2015-08-06 DIAGNOSIS — M2242 Chondromalacia patellae, left knee: Secondary | ICD-10-CM

## 2015-08-06 DIAGNOSIS — R531 Weakness: Secondary | ICD-10-CM | POA: Diagnosis not present

## 2015-08-06 NOTE — Therapy (Signed)
[place], [place], [address] Phone: [phone]   Fax:  [phone]  Physical Therapy Treatment  Patient Details  Name: Jaymien Landin MRN: [id number] Date of Birth: 11/09/1971 Referring Provider:  Silverio Decamp,*  Encounter Date: 08/06/2015      PT End of Session - 08/06/15 1224    Visit Number 2   Number of Visits 6   Date for PT Re-Evaluation 09/09/15   PT Start Time 0702   PT Stop Time 0734   PT Time Calculation (min) 32 min   Activity Tolerance Patient tolerated treatment well;No increased pain      History reviewed. No pertinent past medical history.  History reviewed. No pertinent past surgical history.  There were no vitals filed for this visit.  Visit Diagnosis:  Pain in joint, lower leg, left  Pain in joint, lower leg, right  Weakness generalized  Chondromalacia of both patellae      Subjective Assessment - 08/06/15 0737    Subjective No significant change in knees. He has tried tpe but it just doesn't stay on during the day. Wears it after woork. Has worked on transverse frictioin massage and some exercises. Feels confident in continuing HEP independently and would like to discontinue PT at this time.    Currently in Pain? No/denies            [facility] PT Assessment - 08/06/15 0001    Observation/Other Assessments   Focus on Therapeutic Outcomes (FOTO)  37% limitation    Flexibility   Hamstrings 75 degrees Lt; 68 deg Rt   Quadriceps 145 flexion Rt; 145 deg Lt   ITB tightness noted Bilat Rt > Lt   Piriformis tight bilat   Palpation   Patella mobility limited mobility poor tracking - lateral quad working >>>medial with quad set                     [facility] Adult PT Treatment/Exercise - 08/06/15 0001    Self-Care   Self-Care --  instructed in patellar taping   Knee/Hip Exercises: Stretches   Passive Hamstring Stretch 30 seconds  with strap  opposite knee bent 3 reps    ITB Stretch 30 seconds  Sidelyning LE off edge of table x3   Knee/Hip Exercises: Standing   Forward Step Up 2 sets;10 reps   Step Down 2 sets;20 reps;Step Height: 6"   Step Down Limitations multidirectional heel touch   SLS on foam pad 1-2 min each LE   Other Standing Knee Exercises balance with weight shift forward trunk flexion bilat 1 min each   Knee/Hip Exercises: Supine   Hip Adduction Isometric Strengthening;Both;1 set;10 reps   Straight Leg Raise with External Rotation Strengthening;Both;1 set;10 reps   Patellar Mobs with transverse friction to lateral superior patella    Knee/Hip Exercises: Sidelying   Hip ADduction Both;1 set;10 reps                PT Education - 08/06/15 1222    Education provided Yes   Education Details Instructed in taping technique for bilat knees and encouraged patient to continue with HEP and transverse friction massage on a consistent basis. Progressed HEP. He was encouraged to contact us with any concerns or questions.    Person(s) Educated Patient   Methods Explanation;Demonstration;Tactile cues;Verbal cues;Handout   Comprehension Verbalized understanding;Returned demonstration;Verbal cues required;Tactile cues required          PT Short Term Goals - 08/06/15  1226    PT SHORT TERM GOAL #1   Title I in initial HEP   Time 6   Period Weeks   Status Achieved   PT SHORT TERM GOAL #2   Title Improve LE mobility and flexibility - HS stretch to 75 degrees   Time 6   Period Weeks   Status Achieved   PT SHORT TERM GOAL #3   Title Facilitate good contraction of medial quad   Time 6   Period Weeks   Status Achieved           PT Long Term Goals - 08/06/15 1227    PT LONG TERM GOAL #1   Title I in advanced home exercise program(09/09/15)   Baseline instructed in higher level exercises and provided handouts today. Pt will call with any questions   Time 6   Period Weeks   Status Achieved   PT LONG TERM  GOAL #2   Title patient I in taping for patellar alignment 09/09/15   Time 6   Period Weeks   Status Partially Met   PT LONG TERM GOAL #3   Title Decrease knee pain to allow more functional activity level(09/09/15)   Time 6   Period Weeks   Status Not Met   PT LONG TERM GOAL #4   Title Improve FOTO to =/< 27% limitation(07/21/15)   Time 6   Period Weeks   Status Not Met               Plan - 08/06/15 1224    Clinical Impression Statement Patient was anxious to continue with I HEP and did not wish to come for regular PT visits. He was instructed in patellar taping to improve patellar alignment; approprate stretching and strengtheing program with focus on stretgthening medial quad. Patient will be discharged to I HEP.   Pt will benefit from skilled therapeutic intervention in order to improve on the following deficits Abnormal gait;Decreased range of motion;Difficulty walking;Decreased activity tolerance;Pain;Impaired flexibility;Decreased strength;Decreased mobility   Rehab Potential Good   PT Frequency 1x / week   PT Duration 6 weeks   PT Treatment/Interventions ADLs/Self Care Home Management;Cryotherapy;Electrical Stimulation;Moist Heat;Ultrasound;Therapeutic activities;Therapeutic exercise;Neuromuscular re-education;Patient/family education;Manual techniques;Passive range of motion;Taping;Vasopneumatic Device   PT Next Visit Plan D/C to I HEP   PT Home Exercise Plan transverse friction massage lateral quad at patella; self massage along quads and IT band; stretching; medial quad recruitment; educatioin; HEP; SLS balance activities/heel touch eccentric lowering at step; instruct in taping for patellar allignment.   Consulted and Agree with Plan of Care Patient        Problem List Patient Active Problem List   Diagnosis Date Noted  . Chondromalacia of patellofemoral joint 05/13/2015  . Arthralgia of left acromioclavicular joint 11/25/2014  . Left lumbar radiculopathy  08/12/2014    Danie Hannig Nilda Simmer, PT, MPH 08/06/2015, 12:31 PM  [facility] [place], [place], [address] Phone: [phone]   Fax:  [phone]  PHYSICAL THERAPY DISCHARGE SUMMARY  Visits from Start of Care: 2  Current functional level related to goals / functional outcomes: I in HEP - no significant change in symptoms - as would be expected due to the chronic nature of condition   Remaining deficits: Muscular tightness; muscular imbalance; poor patellar tracking   Education / Equipment: HEP  Plan: Patient agrees to discharge.  Patient goals were partially met. Patient is being discharged due to the patient's request.  ?????   [place]arrette  Nilda Simmer, PT, MPH 08/06/15 12:33pm

## 2015-08-06 NOTE — Patient Instructions (Signed)
Therapeutic - Bridging  With ball between knees Lift buttocks, keeping back straight and arms on floor.Ball between knees.Hold _20___ seconds. Repeat _10-20___ times.    Quads / HF, Supine   Lie near edge of bed, one leg bent, foot flat on bed. Other leg hanging over edge, Bend hanging knee backward keeping thigh in contact with bed. Hold __20_ seconds.  Repeat _3__ times per session. Do __2_ sessions per day.   Iliotibial Band Stretch, Side-Lying   Lie on side, back to edge of bed, top arm in front. Allow top leg to drape behind over edge. Hold ___ seconds.  Repeat ___ times per session. Do ___ sessions per day.    Forward   Facing step, place one leg on step, flexed at hip. Step up slowly, bringing hips in line with knee and shoulder. Bring other foot onto step. Reverse process to step back down. Repeat with other leg. Do __20-30__ repetitions, __1-2__ sets.    Lateral Step-Up   Stand with _6-8__ inch step placed in L direction. Step onto step with right foot, facing forward, and without pushing off with the ground foot.  Touch heel without shifting weight to that foot. Finish with ground foot in touch balance on step and return _20-30__ times. __1-3_ sets _2__ times per day.       Balance: Unilateral - Forward Lean   Stand on left foot, hands on hips. Keeping hips level, bend forward as if to touch forehead to wall. Can have knee slightly bent.  Forward and back. Can hold __10__ seconds. Relax. Repeat _10-20___ times per set. Do _1-2___ sets per session. Do __2__ sessions per day.  ADDUCTION: Isometric   With ball between knees, squeeze them inward. Hold _20__ seconds. Complete __1-3_ sets of _10__ repetitions. Perform _2__ sessions per day.  http://gtsc.exer.us/124   Copyright  [employer]. All rights reserved.  Adduction: Hip - Knees Together (Hook-Lying)   Lie with hips and knees bent, towel roll between knees. Push knees together. Hold for 10-20___  seconds. Rest. Repeat _10-20__ times. Do __2_ times a day.    Lying on side top leg supported on chair. Lift lower leg. Hold for 5-10 sec. 10 reps 1-3 sets.

## 2015-08-14 IMAGING — CR DG KNEE COMPLETE 4+V*R*
2 series · 2 of 2 positions shown · non-contrast
Comparison: None.

CLINICAL DATA: Chronic bilateral knee pain left worse than right.
No injury.

EXAM:
RIGHT KNEE - COMPLETE 4+ VIEW

[knee lat]
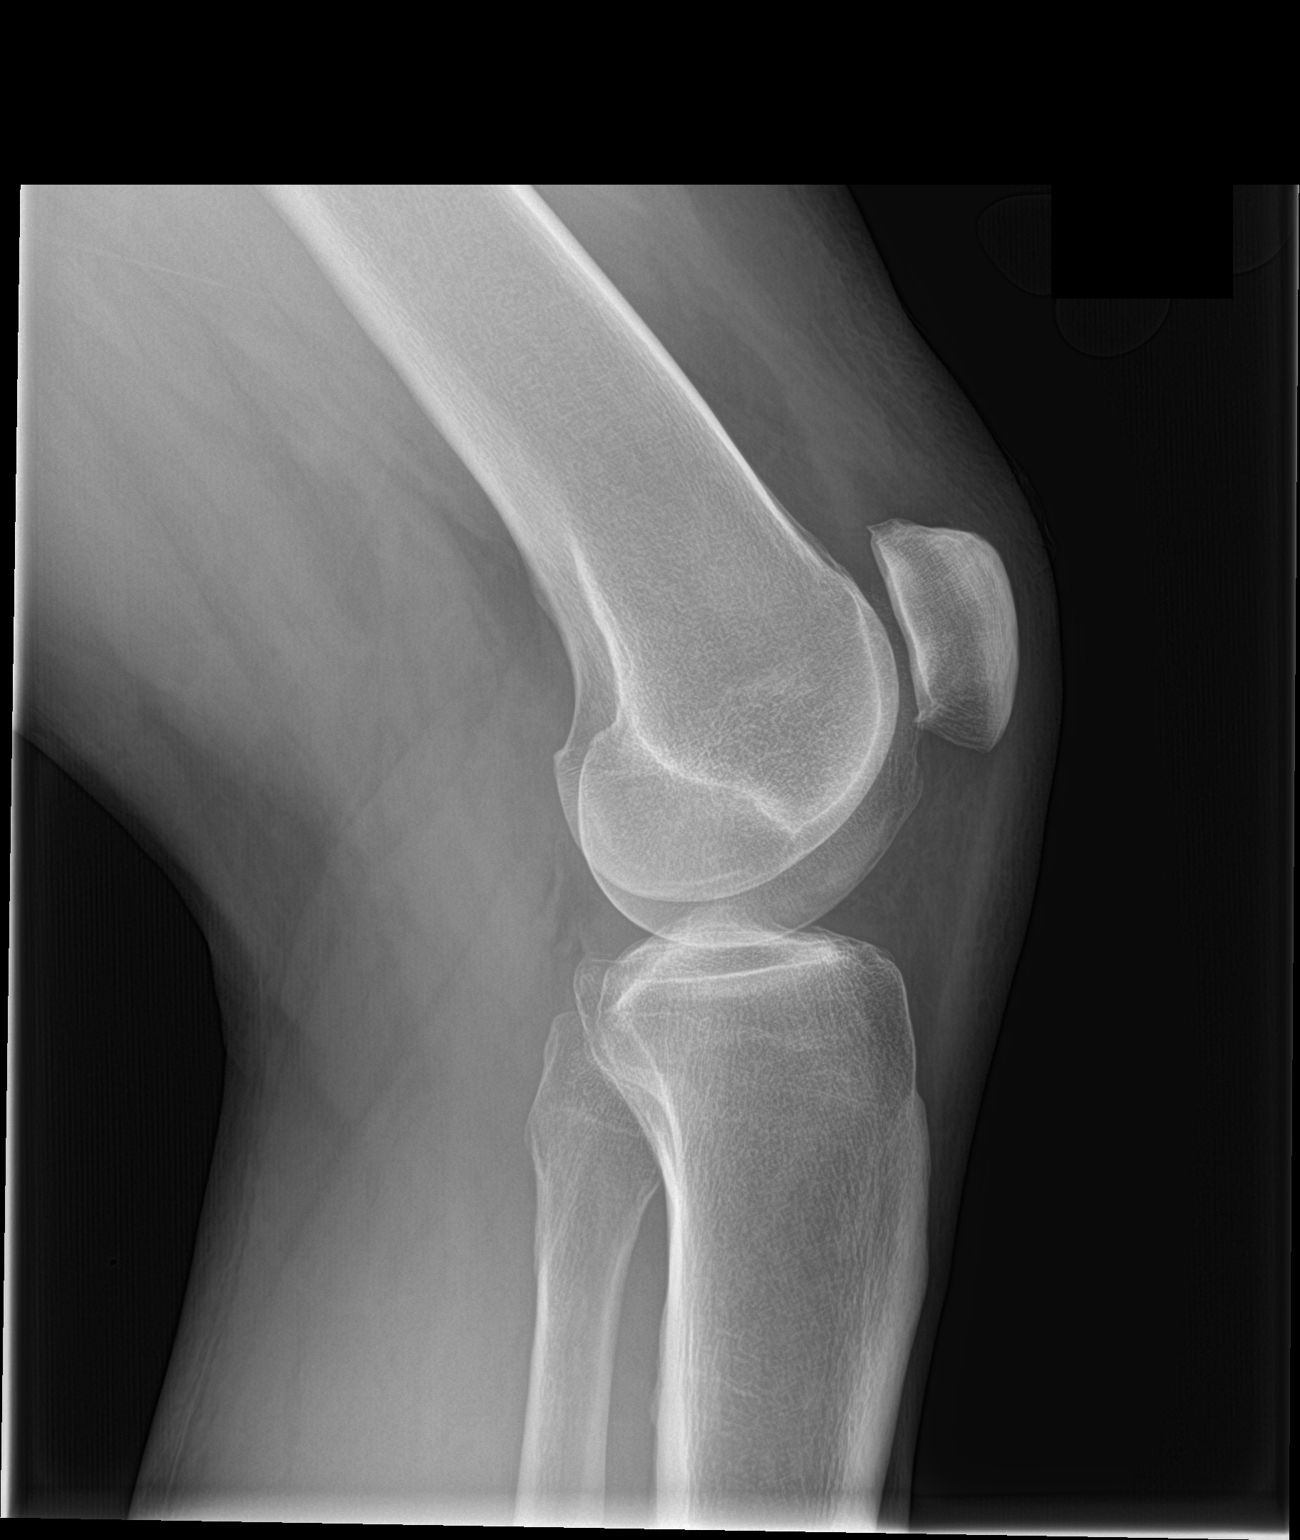

[knee sunrise]
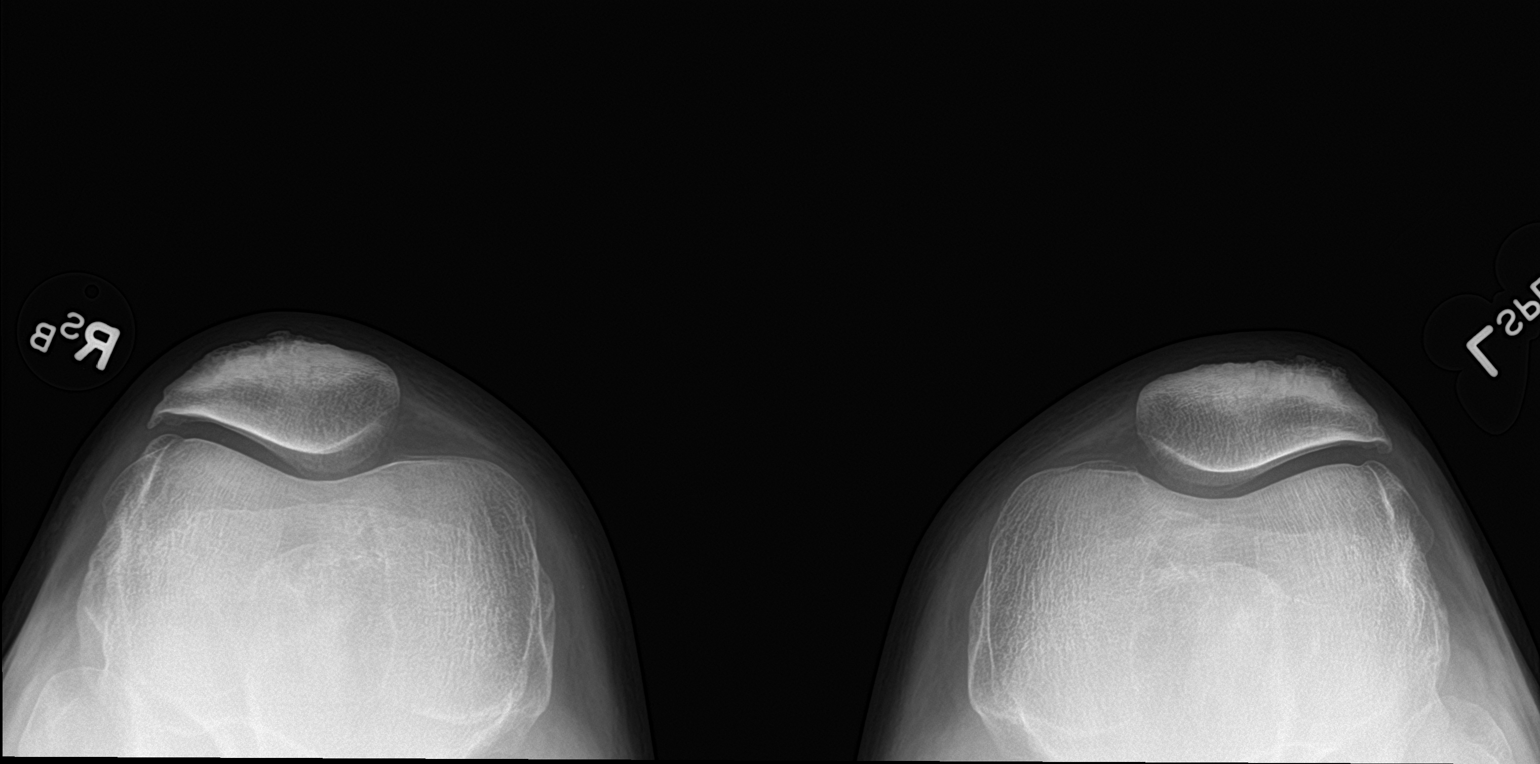

[2 of 2 positions shown; findings below may reference images not displayed]

FINDINGS: Very minimal tricompartmental spurring of the right knee. Joint
space is relatively maintained. Minimal spurring over the left
patellofemoral joint and subtle narrowing of the left medial
compartment. No fracture or dislocation. No significant joint
effusion.
IMPRESSION: Very minimal bilateral osteoarthritic change right worse than left.
No acute findings.
